# Patient Record
Sex: Male | Born: 1980 | Race: White | Hispanic: No | Marital: Single | Smoking: Never smoker
Health system: Southern US, Community
[De-identification: ages and names within clinical notes are randomized; demographics above are authoritative.]

## PROBLEM LIST (undated history)

## (undated) DIAGNOSIS — I10 Essential (primary) hypertension: Secondary | ICD-10-CM

## (undated) DIAGNOSIS — E119 Type 2 diabetes mellitus without complications: Secondary | ICD-10-CM

## (undated) HISTORY — DX: Type 2 diabetes mellitus without complications: E11.9

## (undated) HISTORY — DX: Essential (primary) hypertension: I10

---

## 2009-09-04 ENCOUNTER — Ambulatory Visit: Payer: Self-pay | Admitting: Internal Medicine

## 2013-12-02 DIAGNOSIS — E782 Mixed hyperlipidemia: Secondary | ICD-10-CM | POA: Insufficient documentation

## 2014-04-15 ENCOUNTER — Emergency Department: Payer: Self-pay | Admitting: Emergency Medicine

## 2014-04-15 LAB — COMPREHENSIVE METABOLIC PANEL
AST: 31 U/L (ref 15–37)
Albumin: 3.8 g/dL (ref 3.4–5.0)
Alkaline Phosphatase: 90 U/L
Anion Gap: 8 (ref 7–16)
BUN: 15 mg/dL (ref 7–18)
Bilirubin,Total: 0.4 mg/dL (ref 0.2–1.0)
CO2: 33 mmol/L — AB (ref 21–32)
Calcium, Total: 9.7 mg/dL (ref 8.5–10.1)
Chloride: 98 mmol/L (ref 98–107)
Creatinine: 1.06 mg/dL (ref 0.60–1.30)
EGFR (African American): >60
EGFR (Non-African Amer.): >60
GLUCOSE: 98 mg/dL (ref 65–99)
Osmolality: 278 (ref 275–301)
Potassium: 3.3 mmol/L — ABNORMAL LOW (ref 3.5–5.1)
SGPT (ALT): 51 U/L
SODIUM: 139 mmol/L (ref 136–145)
Total Protein: 8.7 g/dL — ABNORMAL HIGH (ref 6.4–8.2)

## 2014-04-15 LAB — CBC WITH DIFFERENTIAL/PLATELET
Basophil #: 0.1 10*3/uL (ref 0.0–0.1)
Basophil %: 0.7 %
Eosinophil #: 0 10*3/uL (ref 0.0–0.7)
Eosinophil %: 0.3 %
HCT: 43.8 % (ref 40.0–52.0)
HGB: 14.4 g/dL (ref 13.0–18.0)
LYMPHS PCT: 22.7 %
Lymphocyte #: 2.6 10*3/uL (ref 1.0–3.6)
MCH: 26.5 pg (ref 26.0–34.0)
MCHC: 32.9 g/dL (ref 32.0–36.0)
MCV: 81 fL (ref 80–100)
Monocyte #: 0.8 x10 3/mm (ref 0.2–1.0)
Monocyte %: 6.9 %
NEUTROS PCT: 69.4 %
Neutrophil #: 7.9 10*3/uL — ABNORMAL HIGH (ref 1.4–6.5)
PLATELETS: 301 10*3/uL (ref 150–440)
RBC: 5.44 10*6/uL (ref 4.40–5.90)
RDW: 14 % (ref 11.5–14.5)
WBC: 11.4 10*3/uL — AB (ref 3.8–10.6)

## 2014-04-15 LAB — URINALYSIS, COMPLETE
BLOOD: NEGATIVE
Bilirubin,UR: NEGATIVE
Glucose,UR: NEGATIVE mg/dL (ref 0–75)
Ketone: NEGATIVE
Leukocyte Esterase: NEGATIVE
NITRITE: NEGATIVE
Ph: 6 (ref 4.5–8.0)
SQUAMOUS EPITHELIAL: NONE SEEN
Specific Gravity: 1.026 (ref 1.003–1.030)
WBC UR: 1 /HPF (ref 0–5)

## 2014-04-15 LAB — TROPONIN I

## 2014-04-15 LAB — LIPASE, BLOOD: Lipase: 100 U/L (ref 73–393)

## 2014-08-03 DIAGNOSIS — IMO0001 Reserved for inherently not codable concepts without codable children: Secondary | ICD-10-CM | POA: Insufficient documentation

## 2016-04-29 IMAGING — US ABDOMEN ULTRASOUND LIMITED
1 series · 14 of 25 positions shown · non-contrast
Comparison: None.

CLINICAL DATA: Epigastric and right upper quadrant pain.

EXAM:
US ABDOMEN LIMITED - RIGHT UPPER QUADRANT

[Series 1: abdomen ultrasound limited · 0.38mm/px · 14 of 28 slices shown]
[im 1/28]
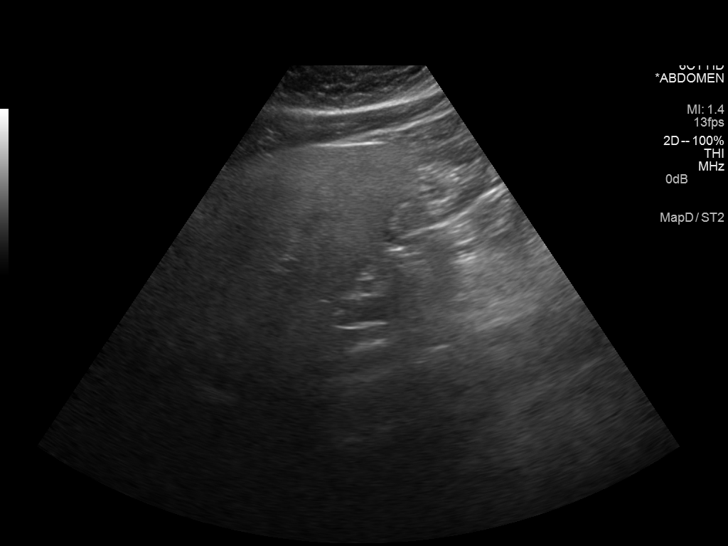
[im 3/28]
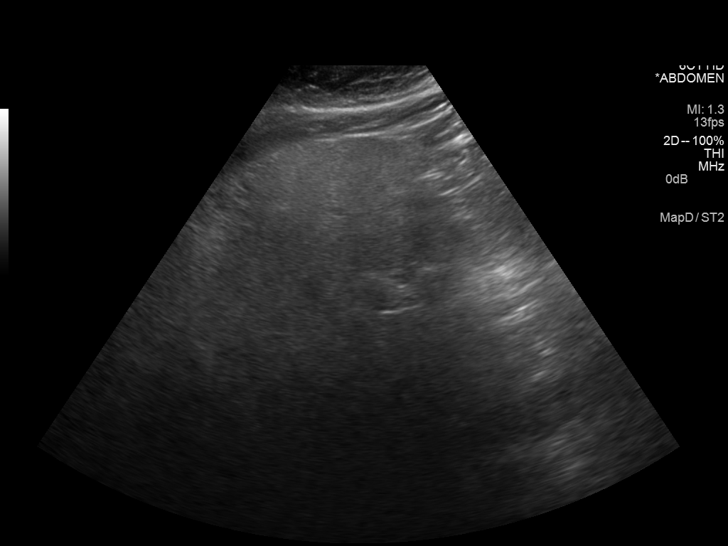
[im 5/28]
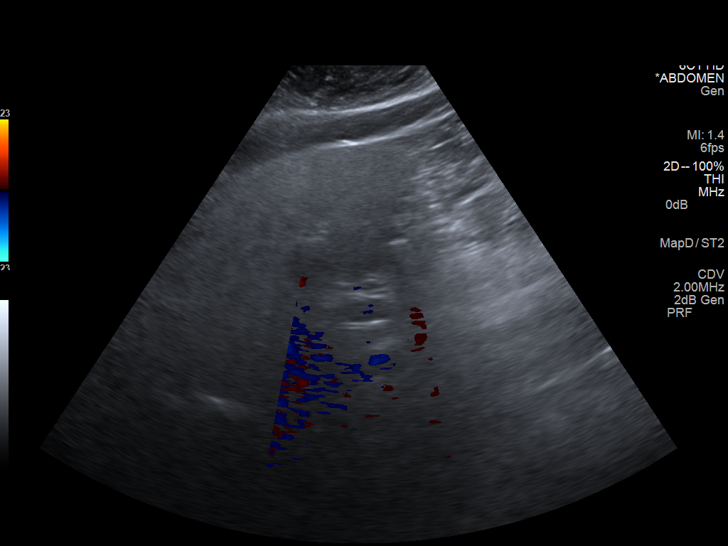
[im 7/28]
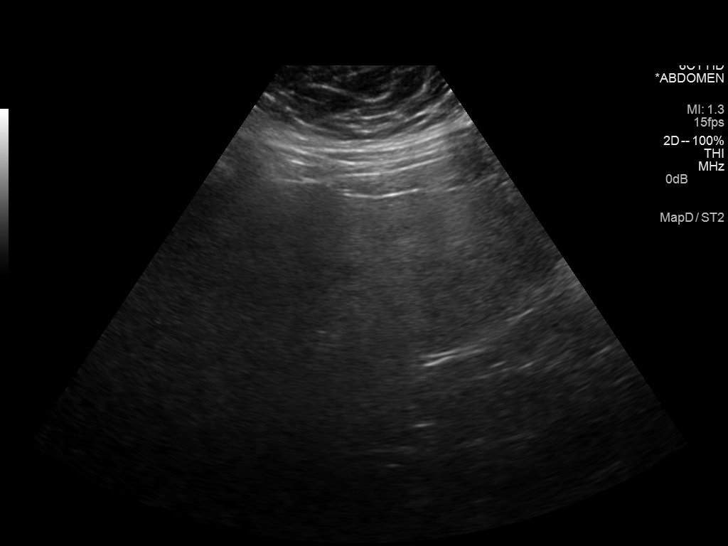
[im 10/28]
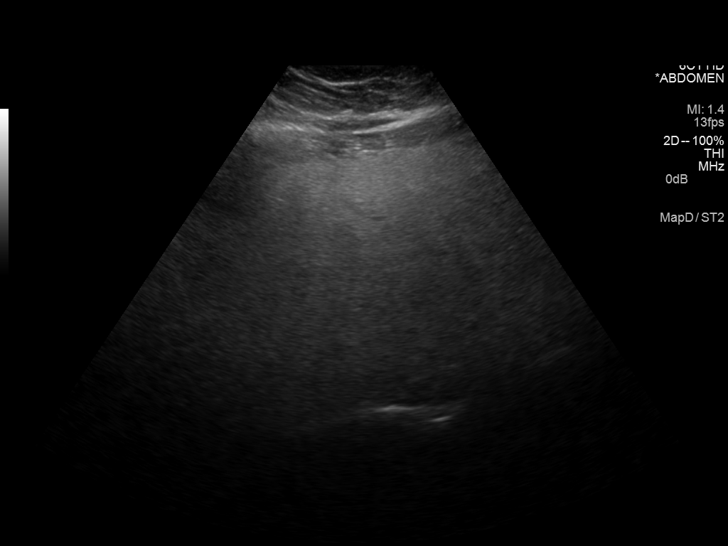
[im 11/28]
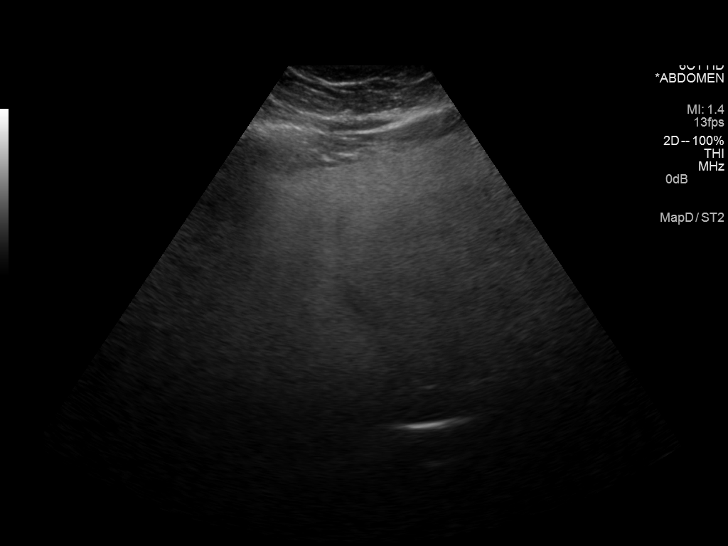
[im 13/28]
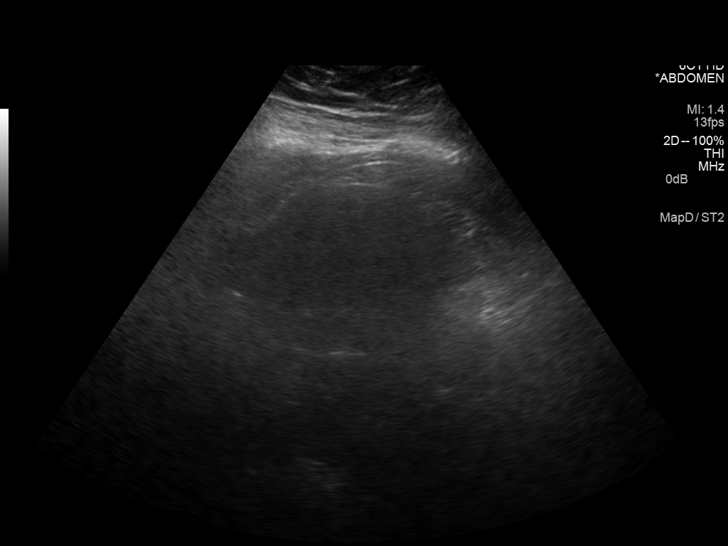
[im 15/28]
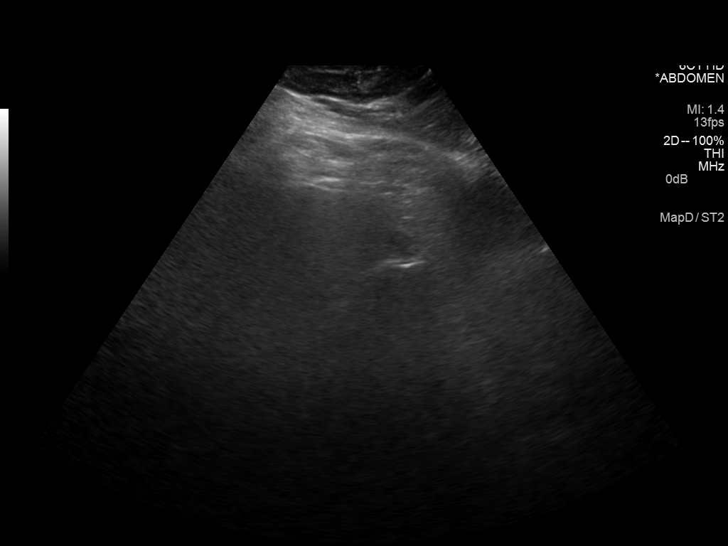
[im 17/28]
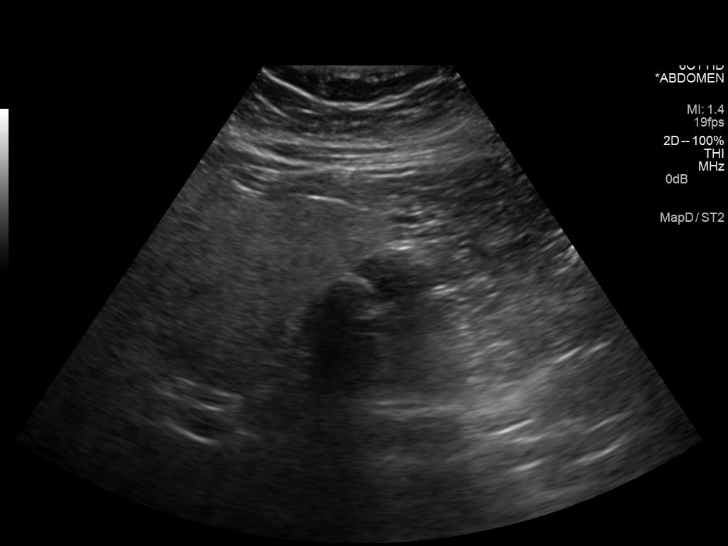
[im 19/28]
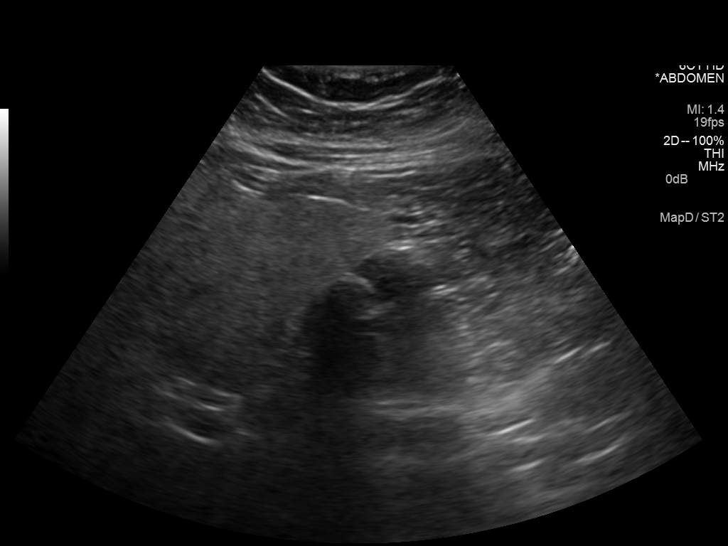
[im 21/28]
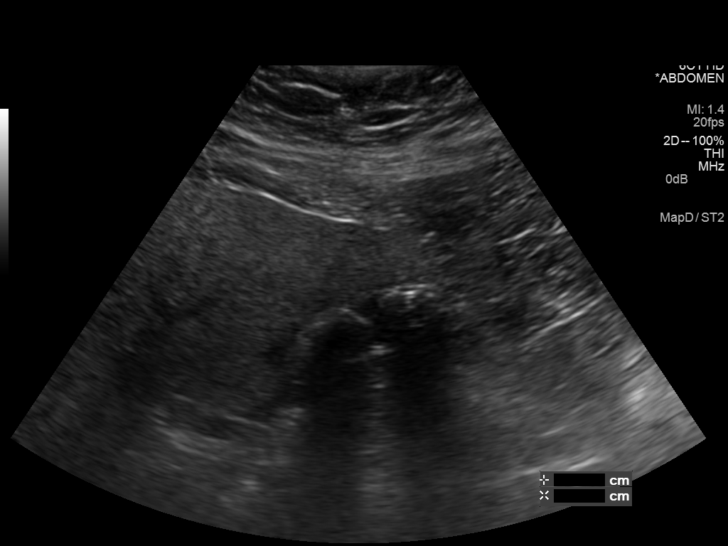
[im 23/28]
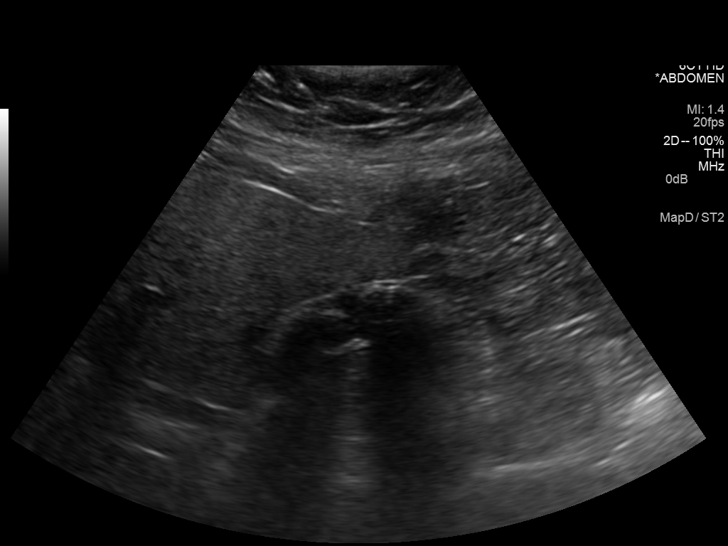
[im 25/28]
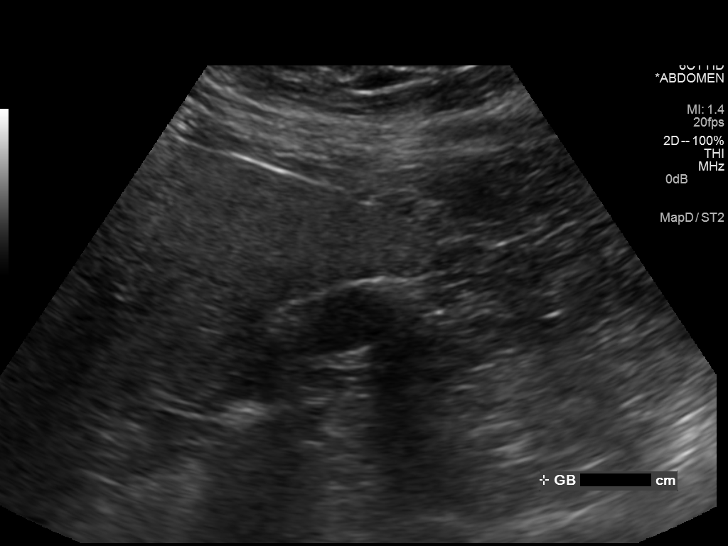
[im 28/28]
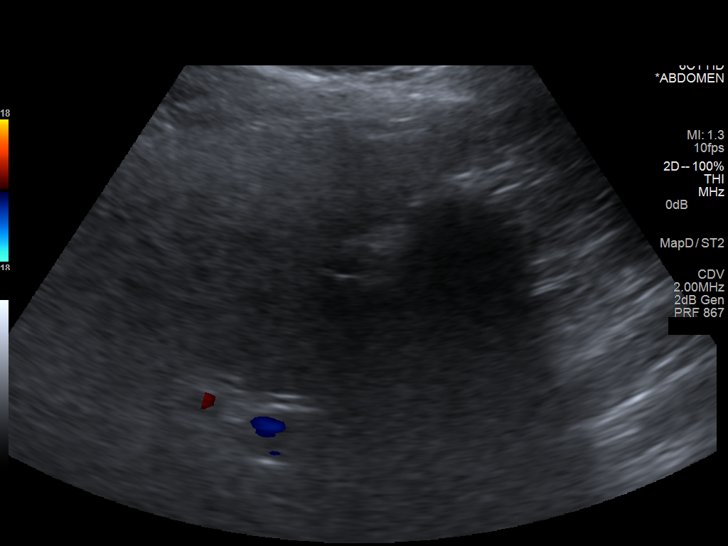

[14 of 25 positions shown; findings below may reference images not displayed]

FINDINGS: Gallbladder:

Gallstones are present. Partially contracted gallbladder without
overt wall thickening. Negative sonographic Murphy sign.

Common bile duct:

Diameter: 3 mm

Liver:

Diffuse increased echogenicity. Focal lesion detection is limited in
this setting.
IMPRESSION: Technically challenging examination due to patient body habitus.

Cholelithiasis without sonographic evidence for cholecystitis.
Correlate with HIDA scan if clinical concern persists.

Hepatic steatosis.

## 2016-07-19 DIAGNOSIS — R7303 Prediabetes: Secondary | ICD-10-CM | POA: Insufficient documentation

## 2016-09-13 ENCOUNTER — Encounter: Payer: Managed Care, Other (non HMO) | Attending: Family Medicine | Admitting: Dietician

## 2016-09-13 ENCOUNTER — Encounter: Payer: Self-pay | Admitting: Dietician

## 2016-09-13 VITALS — Ht 72.0 in | Wt >= 6400 oz

## 2016-09-13 DIAGNOSIS — E782 Mixed hyperlipidemia: Secondary | ICD-10-CM | POA: Diagnosis not present

## 2016-09-13 DIAGNOSIS — Z713 Dietary counseling and surveillance: Secondary | ICD-10-CM | POA: Diagnosis not present

## 2016-09-13 DIAGNOSIS — IMO0001 Reserved for inherently not codable concepts without codable children: Secondary | ICD-10-CM

## 2016-09-13 DIAGNOSIS — I10 Essential (primary) hypertension: Secondary | ICD-10-CM | POA: Diagnosis not present

## 2016-09-13 DIAGNOSIS — R7303 Prediabetes: Secondary | ICD-10-CM

## 2016-09-13 DIAGNOSIS — J309 Allergic rhinitis, unspecified: Secondary | ICD-10-CM | POA: Insufficient documentation

## 2016-09-13 NOTE — Patient Instructions (Signed)
   Work on controlling portions of meats and starches -- especially rice, pasta. Try cooking smaller amounts, use a smaller plate  Add a serving of fruit to meals to help with fulness.  Order smaller portioned meals at restaurants. Limit bread/chips/muffins that are brought to the table.   Include a small breakfast at work daily.   Increase walking or other exercise by including on a regular basis, 15 or more minutes.

## 2016-09-13 NOTE — Progress Notes (Signed)
Medical Nutrition Therapy: Visit start time: 1630  end time: 1730  Assessment:  Diagnosis: HTN, pre-diabetes, obesity Past medical history: GERD Psychosocial issues/ stress concerns: none Preferred learning method:  . Auditory . Visual  Current weight: 466.1lbs with shoes  Height: 6'0" Medications, supplements: reconciled list in medical record.  Progress and evaluation: Patient reports HTN diagnosis within past 2 years, also recent increase in HbA1C to 6%. Has tried weight loss diets such as Atkins, intermittent fasting, calorie counting in the past with success at losing 20-50lbs but only short-term. Reports most significant weight gain of 100lbs in the past 10 years. He reports being overweight since middle school age. He does not eat vegetables at all other than salsa and rarely green peas; reports they will trigger gag reflex. Does eat a few fruits, mainly apples and bananas..    Physical activity: walking (sporadically) on treadmill 30-60 minutes, up to 2 times a week.  Dietary Intake:  Usual eating pattern includes 2 meals and 1-2 snacks per day. Dining out frequency: 5-6 meals per week.  Breakfast: none, limited time. Occasionally sausage biscuit at work Snack: none Lunch: usually out-- [place], pizza, burger. Tries to limit fast food.  Snack: cheez-its crackers, peanuts, salty snacks usually Supper: usually at home-- chicken, rice; spaghetti; fajitas; macaroni casserole; frozen pizza  Snack: maybe crackers, nuts; reports food jags of salty snacks, then sweet snacks. He is tring to limit snack foods in the home.  Beverages: water, diet soda, regular soda 1-2 glasses at lunch/ or occasionally water  Nutrition Care Education: Topics covered: weight management, HTN, diabetes prevention Basic nutrition: basic food groups, appropriate nutrient balance, appropriate meal and snack schedule    Weight control: benefits of weight control, behavioral changes for weight loss-- strategies  for controlling food portions, importance of low fat and low sugar food choices, ways to eliminate 100 or more calories at a time, benefits of tracking food intake, importance of regular exercise.  Advanced nutrition: dining out, food label reading Diabetes prevention:  appropriate meal and snack schedule, appropriate carb intake (general) and balance Hypertension: identifying high sodium foods, identifying food sources of Calcium, potassium, magnesium   Nutritional Diagnosis:  [place]-2.2 Altered nutrition-related laboratory As related to elevated HbA1C, HTN.  As evidenced by lab and MD reports, patient report. [place]-3.3 Overweight/obesity As related to excess calories, inactivity.  As evidenced by patient report, high BMI.  Intervention: Instruction as noted above.   Patient feels calorie counting, strict diet plan would not work for him, wants to make a few changes at a time.    Set goals with direction from patient.    Education Materials given:  . General diet guidelines for Hypertension . Keys for Successful Weight Loss . 100 Ways to Cut 100 Calories . Goals/ instructions  Learner/ who was taught:  . Patient   Level of understanding: Marland Kitchen. Verbalizes/ demonstrates competency  Demonstrated degree of understanding via:   Teach back Learning barriers: . None  Willingness to learn/ readiness for change: . Eager, change in progress  Monitoring and Evaluation:  Dietary intake, exercise, BP and BG control, and body weight      follow up: 11/01/16

## 2016-11-01 ENCOUNTER — Encounter: Payer: Self-pay | Admitting: Dietician

## 2016-11-01 ENCOUNTER — Encounter: Payer: Managed Care, Other (non HMO) | Attending: Family Medicine | Admitting: Dietician

## 2016-11-01 VITALS — Ht 72.0 in | Wt >= 6400 oz

## 2016-11-01 DIAGNOSIS — E782 Mixed hyperlipidemia: Secondary | ICD-10-CM | POA: Insufficient documentation

## 2016-11-01 DIAGNOSIS — IMO0001 Reserved for inherently not codable concepts without codable children: Secondary | ICD-10-CM

## 2016-11-01 DIAGNOSIS — I10 Essential (primary) hypertension: Secondary | ICD-10-CM | POA: Insufficient documentation

## 2016-11-01 DIAGNOSIS — R7303 Prediabetes: Secondary | ICD-10-CM | POA: Diagnosis not present

## 2016-11-01 DIAGNOSIS — Z713 Dietary counseling and surveillance: Secondary | ICD-10-CM | POA: Insufficient documentation

## 2016-11-01 NOTE — Patient Instructions (Addendum)
   Continue with current level of portion control, limiting starches especially.   Keep increasing fruit intake, at least once daily.  Continue with small meal or snack in mornings.   Continue to gradually increase exercise frequency and/or duration.   Practice slow eating -- take smaller bites of food, chew each bite more, or put fork down in between bites of food. Also try taking a few minutes of relaxation time before eating.

## 2016-11-01 NOTE — Progress Notes (Signed)
tMedical Nutrition Therapy: Visit start time: 1630  end time: 1700  Assessment:  Diagnosis: obesity Medical history changes: no changes Psychosocial issues/ stress concerns: none  Current weight: 464.4lbs  Height: 6'0" Medications, supplement changes: no changes Progress and evaluation: Patient reports stressful month since previous visit as his father was in the hospital, hospice care, and passed away 10/06/16. He has been able to make some small changes, including eating more fruit daily and decreasing portions especially at restaurants, bringing leftovers home.   Physical activity: occasional walking, mostly in past 2 weeks.   Dietary Intake:  Usual eating pattern includes 3 meals and 1-2 snacks per day. Dining out frequency: 5 meals per week. More when father was in hospital/ hospice.   Breakfast: fiber one bar 9:30am at work, sometimes none Snack: none Lunch: usually out-- smaller portions in past 2 weeks. Limiting fast food to once a week Snack: banana or apple Supper: sometimes cooked meal, tougher to control portions Snack: ice cream or cookie if dinner is early, if later dinner, then no snack Beverages: water, diet soda, rarely regular soda 1-2 times a week, none at home.   Nutrition Care Education: Topics covered: weight management    Weight control:  Reviewed goal progress and current eating pattern; discussed effects of stress on weight control; discussed strategies for eating more slowly; benefits of regular exercise.  Nutritional Diagnosis:  [place]-2.2 Altered nutrition-related laboratory As related to elevated HbA1C, HTN.  As evidenced by MD and lab reports, patient report. [place]-3.3 Overweight/obesity As related to excess calories, inactivity.  As evidenced by BMI >60, patient report.  Intervention: Discussion as noted above.   Patient will continue with previous diet goals, and will work on slower eating to promote fulness.   He will return for MNT follow-up in July after MD  visit in June.  Education Materials given:  Marland Kitchen. Goals/ instructions  Learner/ who was taught:  . Patient   Level of understanding: Marland Kitchen. Verbalizes/ demonstrates competency  Demonstrated degree of understanding via:   Teach back Learning barriers: . None  Willingness to learn/ readiness for change: . Eager, change in progress  Monitoring and Evaluation:  Dietary intake, exercise, BG and BP control, and body weight      follow up: 01/03/17

## 2017-01-03 ENCOUNTER — Encounter: Payer: Managed Care, Other (non HMO) | Attending: Family Medicine | Admitting: Dietician

## 2017-01-03 ENCOUNTER — Encounter: Payer: Self-pay | Admitting: Dietician

## 2017-01-03 VITALS — Ht 72.0 in | Wt >= 6400 oz

## 2017-01-03 DIAGNOSIS — I10 Essential (primary) hypertension: Secondary | ICD-10-CM | POA: Diagnosis not present

## 2017-01-03 DIAGNOSIS — Z713 Dietary counseling and surveillance: Secondary | ICD-10-CM | POA: Diagnosis not present

## 2017-01-03 DIAGNOSIS — E782 Mixed hyperlipidemia: Secondary | ICD-10-CM | POA: Diagnosis not present

## 2017-01-03 DIAGNOSIS — R7303 Prediabetes: Secondary | ICD-10-CM | POA: Diagnosis not present

## 2017-01-03 DIAGNOSIS — IMO0001 Reserved for inherently not codable concepts without codable children: Secondary | ICD-10-CM

## 2017-01-03 NOTE — Patient Instructions (Signed)
   Find some routine for exercise, try pool exercise or elliptical for less pressure on joints, feet, hips.  Portion snacks to limit intake -- avoid eating from large box or container.   Continue to control portions of starches at restaurants, particularly "appetizer" baskets of bread, chips, etc.

## 2017-01-03 NOTE — Progress Notes (Signed)
Medical Nutrition Therapy: Visit start time: 1630  end time: 1700  Assessment:  Diagnosis: HTN, pre-diabetes, obesity Medical history changes: no changes Psychosocial issues/ stress concerns: none  Current weight: 463.0lbs with shoes  Height: 6'0" Medications, supplement changes: no changes per patient  Progress and evaluation: Weight has decreased by 1.4lbs since previous visit on 11/01/16. Patient reports recent labwork reflected improvement in cholesterol, and stable blood sugar control. He reports ongoing effort to increase fruit intake, control portions, He does report some struggle with mindless eating, overdoing snack portions while watching TV and food portions in general.  He is trying to limit extra starch portions at restaurants.  Physical activity: sporadic walking, maybe 2 times a week, usually 15-20 minutes  Dietary Intake:  Usual eating pattern includes 2-3 meals and 1-2 snacks per day. Dining out frequency: 5-6 meals per week.  Breakfast: pkg cheese and peanut butter crackers or fiber bar Snack: none Lunch: 12-12:30pm usually out, gradually improving choices -- deli, [place], chinese, trying to avoid fast foods, sometimes [employer] on weekend  Snack: banana or apple when arriving home from work. Supper: sandwich, egg salad, lighter meal. Cooks once for 3 meals  Snack: sometimes ice cream (small portion) or crackers unless supper is late Beverages: 4 glasses water, 4-5 glasses diet soda  Nutrition Care Education: Topics covered: weight management Weight control: behavioral changes for weight loss-- mindless eating and basic strategies to overcome mindless eating. Discussed "mindless Eating" book by Lynnell GrainBrian Wansink. Mentioned other weight loss strategies such as measuring portions and tracking intake, and patient does not wish do try these strategies as he feels they would be short-lived. Reviewed role of exercise and options for manageable exercise time and type.    Nutritional Diagnosis:  [place]-2.2 Altered nutrition-related laboratory As related to elevated HbA1C, HTN.  As evidenced by lab report, patient report. [place]-3.3 Overweight/obesity As related to excess calories, inactivity.  As evidenced by BMI >50, patient report.  Intervention: Discussion as noted above.   Updated goals with input from patient.   Patient requests next follow-up in 3 months.  Education Materials given:  Marland Kitchen. Goals/ instructions  Learner/ who was taught:  . Patient   Level of understanding: Marland Kitchen. Verbalizes/ demonstrates competency  Demonstrated degree of understanding via:   Teach back Learning barriers: . None  Willingness to learn/ readiness for change: . Eager, change in progress  Monitoring and Evaluation:  Dietary intake, exercise, BG and BP control, and body weight      follow up: 04/04/17

## 2017-04-04 ENCOUNTER — Ambulatory Visit: Payer: Managed Care, Other (non HMO) | Admitting: Dietician

## 2017-04-25 ENCOUNTER — Ambulatory Visit: Payer: Managed Care, Other (non HMO) | Admitting: Dietician

## 2017-05-16 ENCOUNTER — Encounter: Payer: Self-pay | Admitting: Dietician

## 2017-05-16 NOTE — Progress Notes (Signed)
Have not heard back from patient to reschedule his second missed appointment. Sent discharge letter to referring provider.

## 2017-05-30 ENCOUNTER — Ambulatory Visit: Payer: Managed Care, Other (non HMO) | Admitting: Dietician

## 2017-07-02 ENCOUNTER — Ambulatory Visit: Payer: Managed Care, Other (non HMO) | Admitting: Dietician

## 2017-08-06 ENCOUNTER — Encounter: Payer: BLUE CROSS/BLUE SHIELD | Attending: Family Medicine | Admitting: Dietician

## 2017-08-06 ENCOUNTER — Encounter: Payer: Self-pay | Admitting: Dietician

## 2017-08-06 VITALS — Ht 72.0 in | Wt >= 6400 oz

## 2017-08-06 DIAGNOSIS — Z6841 Body Mass Index (BMI) 40.0 and over, adult: Secondary | ICD-10-CM | POA: Diagnosis not present

## 2017-08-06 DIAGNOSIS — I10 Essential (primary) hypertension: Secondary | ICD-10-CM | POA: Diagnosis not present

## 2017-08-06 DIAGNOSIS — R7303 Prediabetes: Secondary | ICD-10-CM | POA: Diagnosis not present

## 2017-08-06 NOTE — Patient Instructions (Addendum)
Long Term Goal: ability to resume some karate moves, join class or group  Start with some light exercise for 10 minutes 4 days a week or more, ideally every day. Can also add extra movement throughout the day.   Long Term Goal: lose 200lbs and gain health, lose pain  Continue to work on and/or keep control of restaurant choices and portions.   Measure portions of foods and put the rest away.   Include a fruit 2-3 times a day.

## 2017-08-06 NOTE — Progress Notes (Signed)
Medical Nutrition Therapy: Visit start time: 1630  end time: 1730  Assessment:  Diagnosis: HTN, pre-diabetes, obesity Medical history changes: no changes  Psychosocial issues/ stress concerns: none  Current weight: 466.0lbs  Height: 6'0" Medications, supplement changes: reconciled list in medical record  Progress and evaluation: Weight has increased about 3lbs since previous visit on 01/03/17. Patient experienced flare-up of arthritic pain and muscle spasm in back 03/2017 and has not been able to engage in exercise on a regular basis since then. He reports having difficulty with motivation for exercise. He feels exercise will be the key in achieving steady weight loss, as he has been unable to achieve weight loss while working on diet changes alone. He feels he is eating at or below 2000kcal daily, which should be a level to allow for weight loss.  Physical activity: no activity recently  Dietary Intake:  Usual eating pattern includes 3 meals and 1 snacks per day. Dining out frequency: 5-6 meals per week.  Breakfast: 9-9:30am fiber granola bar, sometimes pkg crackers Snack: none Lunch: 12pm usually out reports some large portions -- largest meal of the day Snack: usually none Supper: leftovers, sandwich egg salad, or soup and grilled cheese Snack: sometimes ice cream or quaker snack bar, occasionally eats more Beverages: diet sodas, some water -- trying to do more water  Nutrition Care Education: Topics covered: weight control for HTN and pre-diabetes    Weight control: reviewed progress since 01/03/17 visit; discussed portion control strategies, options for reducing caloric density of diet I.e. Adding fruits (patient does not like any vegetables); estimated calorie needs for weight loss at approximately 2000kcal daily-- patient worked on [employer]2000kcal diet with very limited success; reviewed role of exercise and discussed options.  Other:  benefits of making changes, increasing motivation,  readiness for change  Nutritional Diagnosis:  [place]-2.2 Altered nutrition-related laboratory As related to pre-diabetes, HTN.  As evidenced by lab report, patient report. -3.3 Overweight/obesity As related to excess calories, inactivity.  As evidenced by BMI >50, patient report.  Intervention: Discussion as noted above.   Encouraged patient to consider underlying motivators for losing weight, exercising.   Set goals with input from patient.    Patient would like to continue with MNT follow-ups; will request new referral.   Education Materials given:  Marland Kitchen. Goals/ instructions   Learner/ who was taught:  . Patient   Level of understanding: Marland Kitchen. Verbalizes/ demonstrates competency  Demonstrated degree of understanding via:   Teach back Learning barriers: None  Willingness to learn/ readiness for change: . Acceptance, ready for change  Monitoring and Evaluation:  Dietary intake, exercise, and body weight      follow up: 10/29/17

## 2017-10-29 ENCOUNTER — Ambulatory Visit: Payer: BLUE CROSS/BLUE SHIELD | Admitting: Dietician

## 2017-11-02 ENCOUNTER — Telehealth: Payer: Self-pay | Admitting: Dietician

## 2017-11-02 NOTE — Telephone Encounter (Signed)
Called patient to return voicemail message to cancel his appt on 10/29/17. Left several dates available to reschedule and requested a call back.

## 2022-07-25 ENCOUNTER — Encounter: Payer: Self-pay | Admitting: *Deleted

## 2022-07-25 ENCOUNTER — Encounter: Payer: Self-pay | Attending: Family Medicine | Admitting: *Deleted

## 2022-07-25 VITALS — BP 152/90 | Ht 72.0 in | Wt 384.0 lb

## 2022-07-25 DIAGNOSIS — E785 Hyperlipidemia, unspecified: Secondary | ICD-10-CM | POA: Insufficient documentation

## 2022-07-25 DIAGNOSIS — E1169 Type 2 diabetes mellitus with other specified complication: Secondary | ICD-10-CM | POA: Insufficient documentation

## 2022-07-25 DIAGNOSIS — Z6841 Body Mass Index (BMI) 40.0 and over, adult: Secondary | ICD-10-CM | POA: Insufficient documentation

## 2022-07-25 DIAGNOSIS — E119 Type 2 diabetes mellitus without complications: Secondary | ICD-10-CM

## 2022-07-25 NOTE — Patient Instructions (Signed)
Check blood sugars 1 x day before breakfast or 2 hrs after one meal every day Bring blood sugar records (phone) to the next appointment  Exercise:  Begin walking for    15  minutes   3  days a week and gradually increase to 30 minutes 5 x week  Eat 3 meals day,   2  snacks a day Space meals 4-6 hours apart Increase water intake to 4-5 servings/day  Bring food app on phone to next appointment   Return for appointment/classes on:  Monday September 25, 2022 at 3:30 pm with [facility] (dietitian)

## 2022-07-25 NOTE — Progress Notes (Signed)
Diabetes Self-Management Education  Visit Type: First/Initial  Appt. Start Time: 1010 Appt. End Time: 1130  07/25/2022  [name], identified by name and date of birth, is a 42 y.o. male with a diagnosis of Diabetes: Type 2.   ASSESSMENT  Blood pressure (!) 152/90, height 6' (1.829 m), weight (!) 384 lb (174.2 kg). Body mass index is 52.08 kg/m.   Diabetes Self-Management Education - 07/25/22 1417       Visit Information   Visit Type First/Initial      Initial Visit   Diabetes Type Type 2    Date Diagnosed 4 months    Are you currently following a meal plan? Yes    What type of meal plan do you follow? "low carb diet monitoring intake with app"    Are you taking your medications as prescribed? Yes      Health Coping   How would you rate your overall health? Fair      Psychosocial Assessment   Patient Belief/Attitude about Diabetes Other (comment)   "annoyed, worried"   What is the hardest part about your diabetes right now, causing you the most concern, or is the most worrisome to you about your diabetes?   Making healty food and beverage choices    Self-care barriers None    Self-management support Doctor's office;Internet communities   MyNetDiary app   Patient Concerns Nutrition/Meal planning;Glycemic Control;Medication;Monitoring;Weight Control;Healthy Lifestyle    Special Needs None    Preferred Learning Style Auditory;Visual;Hands on    [place] in progress    How often do you need to have someone help you when you read instructions, pamphlets, or other written materials from your doctor or pharmacy? 1 - Never    What is the last grade level you completed in school? Bachelors      Pre-Education Assessment   Patient understands the diabetes disease and treatment process. Needs Instruction    Patient understands incorporating nutritional management into lifestyle. Needs Review    Patient undertands incorporating physical activity into lifestyle.  Needs Instruction    Patient understands using medications safely. Needs Review    Patient understands monitoring blood glucose, interpreting and using results Needs Review    Patient understands prevention, detection, and treatment of acute complications. Needs Instruction    Patient understands prevention, detection, and treatment of chronic complications. Needs Instruction    Patient understands how to develop strategies to address psychosocial issues. Needs Instruction    Patient understands how to develop strategies to promote health/change behavior. Needs Instruction      Complications   Last HgB A1C per patient/outside source 6.3 %   06/28/2022   How often do you check your blood sugar? 1-2 times/day    Fasting Blood glucose range (mg/dL) 70-129;130-179   FBG's 95-145 mg/dL   Have you had a dilated eye exam in the past 12 months? Yes    Have you had a dental exam in the past 12 months? No    Are you checking your feet? No      Dietary Intake   Breakfast eggs, [place] bacon, toast; English muffin; breakfast burrito    Lunch meat and carb    Snack (afternoon) nuts, trail mix, Atkins bar or sweet    Dinner meat and carb - chicken, beef, pork, potaotes, pinto beans, peas, rice, pasta - doesn't like non-starchy vegetables    Snack (evening) trail mix, Atkins, ice cream    Beverage(s) water, diet drinks  Activity / Exercise   Activity / Exercise Type ADL's      Patient Education   Previous Diabetes Education No   has been to [facility] in 2018-2019 with RD for Obesity   Disease Pathophysiology Definition of diabetes, type 1 and 2, and the diagnosis of diabetes;Factors that contribute to the development of diabetes;Explored patient's options for treatment of their diabetes    Healthy Eating Role of diet in the treatment of diabetes and the relationship between the three main macronutrients and blood glucose level;Food label reading, portion sizes and measuring food.;Carbohydrate  counting;Reviewed blood glucose goals for pre and post meals and how to evaluate the patients' food intake on their blood glucose level.;Other (comment)   Weight loss tips   Being Active Role of exercise on diabetes management, blood pressure control and cardiac health.    Medications Reviewed patients medication for diabetes, action, purpose, timing of dose and side effects.    Monitoring Purpose and frequency of SMBG.;Taught/discussed recording of test results and interpretation of SMBG.;Identified appropriate SMBG and/or A1C goals.    Chronic complications Relationship between chronic complications and blood glucose control    Diabetes Stress and Support Identified and addressed patients feelings and concerns about diabetes      Individualized Goals (developed by patient)   Reducing Risk Other (comment)   improve blood sugars, decrease medications, prevent diabetes complications, lose weight, lead a healthier lifestyle     Outcomes   Expected Outcomes Demonstrated interest in learning. Expect positive outcomes    Future DMSE 2 months         Individualized Plan for Diabetes Self-Management Training:   Learning Objective:  Patient will have a greater understanding of diabetes self-management. Patient education plan is to attend individual and/or group sessions per assessed needs and concerns.   Plan:   Patient Instructions  Check blood sugars 1 x day before breakfast or 2 hrs after one meal every day Bring blood sugar records (phone) to the next appointment  Exercise:  Begin walking for    15  minutes   3  days a week and gradually increase to 30 minutes 5 x week  Eat 3 meals day,   2  snacks a day Space meals 4-6 hours apart Increase water intake to 4-5 servings/day  Bring food app on phone to next appointment   Return for appointment/classes on:  Monday September 25, 2022 at 3:30 pm with Pam (dietitian)  Expected Outcomes:  Demonstrated interest in learning. Expect positive  outcomes  Education material provided:  General Meal Planning Guidelines Simple Meal Plan Healthy Snack Choices (ADA) Making Choices Using Food Labels (ADA) Nutrition Prescription Tips for Successful Weight Loss  If problems or questions, patient to contact team via:   Johny Drilling, RN, [place] [phone]  Future DSME appointment: 2 months September 25, 2022 with the dietitian

## 2022-09-25 ENCOUNTER — Encounter: Payer: Self-pay | Admitting: Dietician

## 2022-09-25 ENCOUNTER — Encounter: Payer: BC Managed Care – PPO | Attending: Family Medicine | Admitting: Dietician

## 2022-09-25 VITALS — BP 130/84 | Ht 72.0 in | Wt 373.3 lb

## 2022-09-25 DIAGNOSIS — E119 Type 2 diabetes mellitus without complications: Secondary | ICD-10-CM | POA: Insufficient documentation

## 2022-09-25 NOTE — Progress Notes (Signed)
Diabetes Self-Management Education  Visit Type:  Follow-up  Appt. Start Time: 1540 Appt. End Time: 1600  09/25/2022  [name], identified by name and date of birth, is a 42 y.o. male with a diagnosis of Diabetes:  Type 2  ASSESSMENT  Blood pressure 130/84, height 6' (1.829 m), weight (!) 373 lb 4.8 oz (169.3 kg). Body mass index is 50.63 kg/m.    Diabetes Self-Management Education - 09/25/22 1537       Complications   How often do you check your blood sugar? 1-2 times/day    Fasting Blood glucose range (mg/dL) 18-335   825-189 occasionally 130s   Postprandial Blood glucose range (mg/dL) 842-103   128-118A   Number of hypoglycemic episodes per month 0    Have you had a dilated eye exam in the past 12 months? Yes    Have you had a dental exam in the past 12 months? No    Are you checking your feet? Yes    How many days per week are you checking your feet? 2      Dietary Intake   Breakfast 3 meals and 1-2 snacks daily    Beverage(s) water 2-3 bottles daily + 2-3 sugar free soda or other drinks      Activity / Exercise   Activity / Exercise Type Light (walking / raking leaves)    How many days per week do you exercise? 2    How many minutes per day do you exercise? 18    Total minutes per week of exercise 36      Patient Education   Healthy Eating Role of diet in the treatment of diabetes and the relationship between the three main macronutrients and blood glucose level;Food label reading, portion sizes and measuring food.;Reviewed blood glucose goals for pre and post meals and how to evaluate the patients' food intake on their blood glucose level.;Meal timing in regards to the patients' current diabetes medication.;Meal options for control of blood glucose level and chronic complications.    Being Active Helped patient identify appropriate exercises in relation to his/her diabetes, diabetes complications and other health issue.    Monitoring Taught/discussed recording of  test results and interpretation of SMBG.    Diabetes Stress and Support Role of stress on diabetes      Post-Education Assessment   Patient understands the diabetes disease and treatment process. Demonstrates understanding / competency    Patient understands incorporating nutritional management into lifestyle. Demonstrates understanding / competency    Patient undertands incorporating physical activity into lifestyle. Demonstrates understanding / competency    Patient understands using medications safely. Demonstrates understanding / competency    Patient understands monitoring blood glucose, interpreting and using results Demonstrates understanding / competency    Patient understands prevention, detection, and treatment of acute complications. Comprehends key points    Patient understands prevention, detection, and treatment of chronic complications. Comprehends key points    Patient understands how to develop strategies to address psychosocial issues. Comprehends key points    Patient understands how to develop strategies to promote health/change behavior. Demonstrates understanding / competency      Outcomes   Program Status Completed             Learning Objective:  Patient will have a greater understanding of diabetes self-management. Patient education plan is to attend individual and/or group sessions per assessed needs and concerns.  Additional Intervention Notes: Patient has lost 11lbs since previous visit on 07/25/22. He reports total weight  loss of about 80lbs since last [facility]NT visit 08/06/17.  He has been diligent with controlling carb intake which is a challenge as he does not eat many vegetables at all. He is motivated to continue with lifestyle changes, has current goal of losing weight to 300lbs or less, and would like to be able to ride a bicycle again, this was the one form of exercise he enjoyed when younger.  Plan:   Patient Instructions  Continue with current healthy  eating pattern and controlled carb intake, great job! Gradually increase physical activity towards goal of returning to cycling.   Expected Outcomes:  Demonstrated interest in learning. Expect positive outcomes  Education material provided: visit summary with goals/ instructions to be viewed via patient portal  If problems or questions, patient to contact team via:  Phone and MyChart  Future DSME appointment: -

## 2022-09-25 NOTE — Patient Instructions (Signed)
Continue with current healthy eating pattern and controlled carb intake, great job! Gradually increase physical activity towards goal of returning to cycling.

## 2023-01-04 ENCOUNTER — Other Ambulatory Visit: Payer: Self-pay | Admitting: Family Medicine

## 2023-01-04 DIAGNOSIS — R1900 Intra-abdominal and pelvic swelling, mass and lump, unspecified site: Secondary | ICD-10-CM

## 2023-01-11 ENCOUNTER — Ambulatory Visit
Admission: RE | Admit: 2023-01-11 | Discharge: 2023-01-11 | Disposition: A | Discharge status: Home / Self Care | Payer: BC Managed Care – PPO | Source: Ambulatory Visit | Attending: Family Medicine | Admitting: Family Medicine

## 2023-01-11 DIAGNOSIS — R1900 Intra-abdominal and pelvic swelling, mass and lump, unspecified site: Secondary | ICD-10-CM

## 2023-01-17 ENCOUNTER — Other Ambulatory Visit: Payer: Self-pay | Admitting: Family Medicine

## 2023-01-17 DIAGNOSIS — R1900 Intra-abdominal and pelvic swelling, mass and lump, unspecified site: Secondary | ICD-10-CM

## 2023-02-05 ENCOUNTER — Other Ambulatory Visit: Payer: BC Managed Care – PPO

## 2023-02-08 ENCOUNTER — Other Ambulatory Visit: Payer: Self-pay | Admitting: Family Medicine

## 2023-02-08 ENCOUNTER — Ambulatory Visit
Admission: RE | Admit: 2023-02-08 | Discharge: 2023-02-08 | Disposition: A | Discharge status: Home / Self Care | Payer: BC Managed Care – PPO | Source: Ambulatory Visit | Attending: Family Medicine | Admitting: Family Medicine

## 2023-02-08 DIAGNOSIS — R1031 Right lower quadrant pain: Secondary | ICD-10-CM

## 2023-02-08 DIAGNOSIS — R1900 Intra-abdominal and pelvic swelling, mass and lump, unspecified site: Secondary | ICD-10-CM

## 2023-02-08 MED ORDER — GADOPICLENOL 0.5 MMOL/ML IV SOLN
10.0000 mL | Freq: Once | INTRAVENOUS | Status: AC | PRN
  Administered 2023-02-08: 10 mL via INTRAVENOUS

## 2023-02-10 ENCOUNTER — Other Ambulatory Visit: Payer: BC Managed Care – PPO

## 2023-02-15 ENCOUNTER — Other Ambulatory Visit: Payer: Self-pay | Admitting: Family Medicine

## 2023-02-15 DIAGNOSIS — M898X9 Other specified disorders of bone, unspecified site: Secondary | ICD-10-CM

## 2023-03-07 ENCOUNTER — Ambulatory Visit
Admission: RE | Admit: 2023-03-07 | Discharge: 2023-03-07 | Disposition: A | Discharge status: Home / Self Care | Payer: BC Managed Care – PPO | Source: Ambulatory Visit | Attending: Family Medicine | Admitting: Family Medicine

## 2023-03-07 DIAGNOSIS — M898X9 Other specified disorders of bone, unspecified site: Secondary | ICD-10-CM

## 2023-03-07 MED ORDER — GADOPICLENOL 0.5 MMOL/ML IV SOLN
10.0000 mL | Freq: Once | INTRAVENOUS | Status: AC | PRN
  Administered 2023-03-07: 10 mL via INTRAVENOUS
